# Patient Record
Sex: Female | Born: 1975 | Race: White | Hispanic: No | Marital: Married | Smoking: Former smoker
Health system: Southern US, Community
[De-identification: ages and names within clinical notes are randomized; demographics above are authoritative.]

## PROBLEM LIST (undated history)

## (undated) HISTORY — PX: REDUCTION MAMMAPLASTY: SUR839

## (undated) HISTORY — PX: BREAST SURGERY: SHX581

## (undated) HISTORY — PX: HERNIA REPAIR: SHX51

## (undated) HISTORY — PX: AUGMENTATION MAMMAPLASTY: SUR837

## (undated) HISTORY — PX: TONSILLECTOMY AND ADENOIDECTOMY: SHX28

---

## 2018-03-14 ENCOUNTER — Ambulatory Visit (INDEPENDENT_AMBULATORY_CARE_PROVIDER_SITE_OTHER): Payer: Managed Care, Other (non HMO) | Admitting: Obstetrics and Gynecology

## 2018-03-14 ENCOUNTER — Other Ambulatory Visit (HOSPITAL_COMMUNITY)
Admission: RE | Admit: 2018-03-14 | Discharge: 2018-03-14 | Disposition: A | Discharge status: Home / Self Care | Payer: Managed Care, Other (non HMO) | Source: Ambulatory Visit | Attending: Obstetrics and Gynecology | Admitting: Obstetrics and Gynecology

## 2018-03-14 ENCOUNTER — Encounter: Payer: Self-pay | Admitting: Obstetrics and Gynecology

## 2018-03-14 VITALS — BP 115/78 | HR 78 | Ht 64.0 in | Wt 189.1 lb

## 2018-03-14 DIAGNOSIS — Z124 Encounter for screening for malignant neoplasm of cervix: Secondary | ICD-10-CM | POA: Insufficient documentation

## 2018-03-14 DIAGNOSIS — Z01419 Encounter for gynecological examination (general) (routine) without abnormal findings: Secondary | ICD-10-CM

## 2018-03-14 DIAGNOSIS — Z1239 Encounter for other screening for malignant neoplasm of breast: Secondary | ICD-10-CM

## 2018-03-14 NOTE — Progress Notes (Signed)
HPI:      [name] is a 43 y.o. No obstetric history on file. who LMP was No LMP recorded.  Subjective:   She presents today for her annual examination.  She has no specific complaints.  She has never had a mammogram and knows that she is overdue.  She has an IUD in place for 7 years (Mirena) she still is not having menstrual periods.  She would like it removed and replaced in the near future.  She is using it for birth control.  She is due for blood work (basic screening) and Pap.    Hx: The following portions of the patient's history were reviewed and updated as appropriate:             She  has no past medical history on file. She does not have a problem list on file. She  has a past surgical history that includes Cesarean section; Hernia repair; Tonsillectomy and adenoidectomy; and Breast surgery. Her family history includes Cancer in her father; Diabetes in her mother; Heart failure in her father. She  reports that she has quit smoking. She has never used smokeless tobacco. She reports current alcohol use. She reports that she does not use drugs. She currently has no medications in their medication list. She has no allergies on file.       Review of Systems:  Review of Systems  Constitutional: Denied constitutional symptoms, night sweats, recent illness, fatigue, fever, insomnia and weight loss.  Eyes: Denied eye symptoms, eye pain, photophobia, vision change and visual disturbance.  Ears/Nose/Throat/Neck: Denied ear, nose, throat or neck symptoms, hearing loss, nasal discharge, sinus congestion and sore throat.  Cardiovascular: Denied cardiovascular symptoms, arrhythmia, chest pain/pressure, edema, exercise intolerance, orthopnea and palpitations.  Respiratory: Denied pulmonary symptoms, asthma, pleuritic pain, productive sputum, cough, dyspnea and wheezing.  Gastrointestinal: Denied, gastro-esophageal reflux, melena, nausea and vomiting.  Genitourinary: Denied genitourinary  symptoms including symptomatic vaginal discharge, pelvic relaxation issues, and urinary complaints.  Musculoskeletal: Denied musculoskeletal symptoms, stiffness, swelling, muscle weakness and myalgia.  Dermatologic: Denied dermatology symptoms, rash and scar.  Neurologic: Denied neurology symptoms, dizziness, headache, neck pain and syncope.  Psychiatric: Denied psychiatric symptoms, anxiety and depression.  Endocrine: Denied endocrine symptoms including hot flashes and night sweats.   Meds:   No current outpatient medications on file prior to visit.   No current facility-administered medications on file prior to visit.     Objective:     Vitals:   03/14/18 1108  BP: 115/78  Pulse: 78              Physical examination General NAD, Conversant  HEENT Atraumatic; Op clear with mmm.  Normo-cephalic. Pupils reactive. Anicteric sclerae  Thyroid/Neck Smooth without nodularity or enlargement. Normal ROM.  Neck Supple.  Skin No rashes, lesions or ulceration. Normal palpated skin turgor. No nodularity.  Breasts: No masses or discharge.  Symmetric.  No axillary adenopathy.  Lungs: Clear to auscultation.No rales or wheezes. Normal Respiratory effort, no retractions.  Heart: NSR.  No murmurs or rubs appreciated. No periferal edema  Abdomen: Soft.  Non-tender.  No masses.  No HSM. No hernia  Extremities: Moves all appropriately.  Normal ROM for age. No lymphadenopathy.  Neuro: Oriented to PPT.  Normal mood. Normal affect.     Pelvic:   Vulva: Normal appearance.  No lesions.  Vagina: No lesions or abnormalities noted.  Support:  Second-degree cystocele and rectocele  Urethra No masses tenderness or scarring.  Meatus Normal size  without lesions or prolapse.  Cervix: Normal appearance.  No lesions.  IUD strings noted at the cervical loss  Anus: Normal exam.  No lesions.  Perineum: Normal exam.  No lesions.        Bimanual   Uterus: Normal size.  Non-tender.  Mobile.  AV.  Adnexae: No  masses.  Non-tender to palpation.  Cul-de-sac: Negative for abnormality.      Assessment:    No obstetric history on file. There are no active problems to display for this patient.    1. Well woman exam   2. Breast cancer screening   3. Cervical cancer screening     Normal exam-IUD in place but overdue for removal and replacement   Plan:            1.  Basic Screening Recommendations The basic screening recommendations for asymptomatic women were discussed with the patient during her visit.  The age-appropriate recommendations were discussed with her and the rational for the tests reviewed.  When I am informed by the patient that another primary care physician has previously obtained the age-appropriate tests and they are up-to-date, only outstanding tests are ordered and referrals given as necessary.  Abnormal results of tests will be discussed with her when all of her results are completed. Pap performed-mammogram ordered-blood work ordered. 2.  Patient will schedule for IUD removal and replacement. Orders Orders Placed This Encounter  Procedures  . MM 3D SCREEN BREAST BILATERAL  . Glucose, random  . Lipid panel  . TSH    No orders of the defined types were placed in this encounter.       F/U  No follow-ups on file.  Elonda Husky, M.D. 03/14/2018 11:47 AM

## 2018-03-14 NOTE — Progress Notes (Signed)
Patient comes in today for new GYN physical. She is due for Pap and labs today. Patient has eaten. She does not have a family history of breast cancer.

## 2018-03-20 ENCOUNTER — Encounter: Payer: Self-pay | Admitting: Obstetrics and Gynecology

## 2018-03-20 ENCOUNTER — Other Ambulatory Visit: Payer: Managed Care, Other (non HMO)

## 2018-03-20 ENCOUNTER — Ambulatory Visit (INDEPENDENT_AMBULATORY_CARE_PROVIDER_SITE_OTHER): Payer: Managed Care, Other (non HMO) | Admitting: Obstetrics and Gynecology

## 2018-03-20 VITALS — BP 121/80 | HR 55 | Ht 64.0 in | Wt 191.3 lb

## 2018-03-20 DIAGNOSIS — Z30433 Encounter for removal and reinsertion of intrauterine contraceptive device: Secondary | ICD-10-CM

## 2018-03-20 NOTE — Progress Notes (Signed)
Patient comes in today for IUD removal and reinsertion. No concerns today.

## 2018-03-20 NOTE — Progress Notes (Signed)
HPI:      [name] is a 43 y.o. No obstetric history on file. who LMP was No LMP recorded.  Subjective:   She presents today for IUD removal and reinsertion.  Her IUD is overdue for removal but she has not begun having menstrual periods yet.    Hx: The following portions of the patient's history were reviewed and updated as appropriate:             She  has no past medical history on file. She does not have a problem list on file. She  has a past surgical history that includes Cesarean section; Hernia repair; Tonsillectomy and adenoidectomy; and Breast surgery. Her family history includes Cancer in her father; Diabetes in her mother; Heart failure in her father. She  reports that she has quit smoking. She has never used smokeless tobacco. She reports current alcohol use. She reports that she does not use drugs. She currently has no medications in their medication list. She has no allergies on file.       Review of Systems:  Review of Systems  Constitutional: Denied constitutional symptoms, night sweats, recent illness, fatigue, fever, insomnia and weight loss.  Eyes: Denied eye symptoms, eye pain, photophobia, vision change and visual disturbance.  Ears/Nose/Throat/Neck: Denied ear, nose, throat or neck symptoms, hearing loss, nasal discharge, sinus congestion and sore throat.  Cardiovascular: Denied cardiovascular symptoms, arrhythmia, chest pain/pressure, edema, exercise intolerance, orthopnea and palpitations.  Respiratory: Denied pulmonary symptoms, asthma, pleuritic pain, productive sputum, cough, dyspnea and wheezing.  Gastrointestinal: Denied, gastro-esophageal reflux, melena, nausea and vomiting.  Genitourinary: Denied genitourinary symptoms including symptomatic vaginal discharge, pelvic relaxation issues, and urinary complaints.  Musculoskeletal: Denied musculoskeletal symptoms, stiffness, swelling, muscle weakness and myalgia.  Dermatologic: Denied dermatology symptoms,  rash and scar.  Neurologic: Denied neurology symptoms, dizziness, headache, neck pain and syncope.  Psychiatric: Denied psychiatric symptoms, anxiety and depression.  Endocrine: Denied endocrine symptoms including hot flashes and night sweats.   Meds:   No current outpatient medications on file prior to visit.   No current facility-administered medications on file prior to visit.     Objective:     Vitals:   03/20/18 0831  BP: 121/80  Pulse: (!) 55    Physical examination   Pelvic:   Vulva: Normal appearance.  No lesions.  Vagina: No lesions or abnormalities noted.  Support: Normal pelvic support.  Urethra No masses tenderness or scarring.  Meatus Normal size without lesions or prolapse.  Cervix: Normal appearance.  No lesions.  Anus: Normal exam.  No lesions.  Perineum: Normal exam.  No lesions.        Bimanual   Uterus: Normal size.  Non-tender.  Mobile.  AV.  Adnexae: No masses.  Non-tender to palpation.  Cul-de-sac: Negative for abnormality.   IUD Removal Strings of IUD identified and grasped.  IUD removed without problem.  Pt tolerated this well.  IUD noted to be intact.   IUD Procedure Pt has read the booklet and signed the appropriate forms regarding the Mirena IUD.  All of her questions have been answered.   The cervix was cleansed with betadine solution.  After sounding the uterus and noting the position, the IUD was placed in the usual manner without problem.  The string was cut to the appropriate length.  The patient tolerated the procedure well.              Assessment:    No obstetric history on file. There are  no active problems to display for this patient.    1. Encounter for removal and reinsertion of intrauterine contraceptive device (IUD)       Plan:             F/U  Return in about 4 weeks (around 04/17/2018) for For IUD f/u.  Elonda Husky, M.D. 03/20/2018 9:04 AM

## 2018-03-21 LAB — LIPID PANEL
Chol/HDL Ratio: 4.5 ratio — ABNORMAL HIGH (ref 0.0–4.4)
Cholesterol, Total: 196 mg/dL (ref 100–199)
HDL: 44 mg/dL (ref >39)
LDL Calculated: 128 mg/dL — ABNORMAL HIGH (ref 0–99)
Triglycerides: 118 mg/dL (ref 0–149)
VLDL Cholesterol Cal: 24 mg/dL (ref 5–40)

## 2018-03-21 LAB — GLUCOSE, RANDOM: Glucose: 85 mg/dL (ref 65–99)

## 2018-03-21 LAB — TSH: TSH: 4.89 u[IU]/mL — ABNORMAL HIGH (ref 0.450–4.500)

## 2018-03-21 LAB — CYTOLOGY - PAP: HPV: NOT DETECTED

## 2018-04-16 ENCOUNTER — Encounter: Payer: Managed Care, Other (non HMO) | Admitting: Obstetrics and Gynecology

## 2018-04-17 ENCOUNTER — Encounter: Payer: Self-pay | Admitting: Obstetrics and Gynecology

## 2018-04-17 ENCOUNTER — Other Ambulatory Visit: Payer: Self-pay

## 2018-04-17 ENCOUNTER — Ambulatory Visit (INDEPENDENT_AMBULATORY_CARE_PROVIDER_SITE_OTHER): Payer: Managed Care, Other (non HMO) | Admitting: Obstetrics and Gynecology

## 2018-04-17 VITALS — Ht 64.0 in | Wt 191.0 lb

## 2018-04-17 DIAGNOSIS — Z30431 Encounter for routine checking of intrauterine contraceptive device: Secondary | ICD-10-CM

## 2018-04-17 DIAGNOSIS — E039 Hypothyroidism, unspecified: Secondary | ICD-10-CM | POA: Diagnosis not present

## 2018-04-17 NOTE — Progress Notes (Signed)
Virtual Visit via Telephone Note  I connected with Lynn Rhodes on 04/17/18 at  2:30 PM EDT by telephone and verified that I am speaking with the correct person using two identifiers.   I discussed the limitations, risks, security and privacy concerns of performing an evaluation and management service by telephone and the availability of in person appointments. I also discussed with the patient that there may be a patient responsible charge related to this service. The patient expressed understanding and agreed to proceed.  Location of patient:  Home  Patient gave explicit verbal consent for telephone visit:  YES  Location of provider:  [facility] office  Persons other than physician and patient involved in provider conference:  None  History of Present Illness:    Patient states she has no problems with her IUD.  Had a few days of spotting but this resolved. She would like to know the results of her other lab work.  She has seen them on my chart and had some concerns.    Observations/Objective:      Assessment: Elevated TSH-possible subclinical hypothyroidism. Abnormal cytology on Pap but negative HPV-this is certainly unusual and doubt of immediate clinical significance. Borderline cholesterol with elevated LDL  Plan:   As patient is subjectively asymptomatic I would recommend full thyroid panel after COVID-19 settles.  I have discussed this in detail with her. Discussed HPV and cytology in the role of cervical cancer.  Discussed possible future colposcopy.  I have recommended an annual examination next year with a Pap and HPV testing again 1 year from this. Discussed dietary recommendations regarding elevated cholesterol and LDL.  Patient will undergo modified diet and attempt to reduce her cholesterol level.   Follow Up Instructions:   Patient to follow-up when COVID-19 becomes less of an issue for full thyroid panel.    I discussed the assessment and treatment plan with the patient.   The patient agreed with the plan and demonstrated an understanding of the instructions.  She was given an opportunity to ask questions and all of her questions were answered.   The patient was advised to call back or seek an in-person evaluation if the symptoms worsen or if the condition fails to improve as anticipated.  I provided 16 minutes of non-face-to-face time during this encounter.   Brennan Bailey, MD

## 2018-05-19 ENCOUNTER — Telehealth: Payer: Self-pay

## 2018-05-19 NOTE — Telephone Encounter (Signed)
Patient called and left a message to make an appointment. No answer when the patient was called back.

## 2018-11-04 ENCOUNTER — Telehealth: Payer: Self-pay | Admitting: Obstetrics and Gynecology

## 2018-11-04 ENCOUNTER — Other Ambulatory Visit: Payer: Self-pay

## 2018-11-04 DIAGNOSIS — Z20822 Contact with and (suspected) exposure to covid-19: Secondary | ICD-10-CM

## 2018-11-04 NOTE — Telephone Encounter (Signed)
The patient called and stated that she needs to have covid-19 testing done. The patient is requesting an order/referral to be placed. Please advise.

## 2018-11-04 NOTE — Telephone Encounter (Signed)
LM for patient that she does not need an order to have covid test. She is able to go through the drive up testing site at [facility]. Left number to call back if she has any questions.

## 2018-11-05 LAB — NOVEL CORONAVIRUS, NAA: SARS-CoV-2, NAA: NOT DETECTED

## 2019-03-04 ENCOUNTER — Other Ambulatory Visit
Admission: RE | Admit: 2019-03-04 | Discharge: 2019-03-04 | Disposition: A | Discharge status: Home / Self Care | Payer: Managed Care, Other (non HMO) | Source: Ambulatory Visit | Attending: Gastroenterology | Admitting: Gastroenterology

## 2019-03-04 DIAGNOSIS — R1084 Generalized abdominal pain: Secondary | ICD-10-CM | POA: Insufficient documentation

## 2019-03-04 LAB — COMPREHENSIVE METABOLIC PANEL
ALT: 23 U/L (ref 0–44)
AST: 22 U/L (ref 15–41)
Albumin: 4.2 g/dL (ref 3.5–5.0)
Alkaline Phosphatase: 56 U/L (ref 38–126)
Anion gap: 11 (ref 5–15)
BUN: 8 mg/dL (ref 6–20)
CO2: 24 mmol/L (ref 22–32)
Calcium: 9.1 mg/dL (ref 8.9–10.3)
Chloride: 102 mmol/L (ref 98–111)
Creatinine, Ser: 0.69 mg/dL (ref 0.44–1.00)
GFR calc Af Amer: >60 mL/min (ref >60)
GFR calc non Af Amer: >60 mL/min (ref >60)
Glucose, Bld: 97 mg/dL (ref 70–99)
Potassium: 3.6 mmol/L (ref 3.5–5.1)
Sodium: 137 mmol/L (ref 135–145)
Total Bilirubin: 0.7 mg/dL (ref 0.3–1.2)
Total Protein: 7.8 g/dL (ref 6.5–8.1)

## 2019-03-04 LAB — CBC WITH DIFFERENTIAL/PLATELET
Abs Immature Granulocytes: 0.02 10*3/uL (ref 0.00–0.07)
Basophils Absolute: 0 10*3/uL (ref 0.0–0.1)
Basophils Relative: 1 %
Eosinophils Absolute: 0.2 10*3/uL (ref 0.0–0.5)
Eosinophils Relative: 3 %
HCT: 46.5 % — ABNORMAL HIGH (ref 36.0–46.0)
Hemoglobin: 15.5 g/dL — ABNORMAL HIGH (ref 12.0–15.0)
Immature Granulocytes: 0 %
Lymphocytes Relative: 24 %
Lymphs Abs: 1.7 10*3/uL (ref 0.7–4.0)
MCH: 32.7 pg (ref 26.0–34.0)
MCHC: 33.3 g/dL (ref 30.0–36.0)
MCV: 98.1 fL (ref 80.0–100.0)
Monocytes Absolute: 0.5 10*3/uL (ref 0.1–1.0)
Monocytes Relative: 7 %
Neutro Abs: 4.6 10*3/uL (ref 1.7–7.7)
Neutrophils Relative %: 65 %
Platelets: 244 10*3/uL (ref 150–400)
RBC: 4.74 MIL/uL (ref 3.87–5.11)
RDW: 11.7 % (ref 11.5–15.5)
WBC: 7 10*3/uL (ref 4.0–10.5)
nRBC: 0 % (ref 0.0–0.2)

## 2019-03-04 LAB — HCG, QUANTITATIVE, PREGNANCY: hCG, Beta Chain, Quant, S: <1 m[IU]/mL (ref <5)

## 2019-03-04 LAB — LIPASE, BLOOD: Lipase: 22 U/L (ref 11–51)

## 2019-03-06 ENCOUNTER — Other Ambulatory Visit: Payer: Self-pay | Admitting: Gastroenterology

## 2019-03-06 ENCOUNTER — Ambulatory Visit
Admission: RE | Admit: 2019-03-06 | Discharge: 2019-03-06 | Disposition: A | Discharge status: Home / Self Care | Payer: Managed Care, Other (non HMO) | Source: Ambulatory Visit | Attending: Gastroenterology | Admitting: Gastroenterology

## 2019-03-06 ENCOUNTER — Other Ambulatory Visit: Payer: Self-pay

## 2019-03-06 DIAGNOSIS — R1084 Generalized abdominal pain: Secondary | ICD-10-CM | POA: Diagnosis not present

## 2019-03-06 MED ORDER — IOHEXOL 300 MG/ML  SOLN
100.0000 mL | Freq: Once | INTRAMUSCULAR | Status: AC | PRN
  Administered 2019-03-06: 100 mL via INTRAVENOUS

## 2019-10-02 ENCOUNTER — Other Ambulatory Visit: Payer: Self-pay

## 2019-10-02 ENCOUNTER — Ambulatory Visit: Payer: Managed Care, Other (non HMO) | Admitting: Obstetrics and Gynecology

## 2019-10-02 ENCOUNTER — Encounter: Payer: Self-pay | Admitting: Obstetrics and Gynecology

## 2019-10-02 VITALS — BP 119/79 | HR 75 | Ht 64.0 in | Wt 200.2 lb

## 2019-10-02 DIAGNOSIS — F32A Depression, unspecified: Secondary | ICD-10-CM

## 2019-10-02 DIAGNOSIS — F329 Major depressive disorder, single episode, unspecified: Secondary | ICD-10-CM

## 2019-10-02 MED ORDER — SERTRALINE HCL 50 MG PO TABS: 50.0000 mg | ORAL_TABLET | Freq: Every day | ORAL | 1 refills | Status: DC

## 2019-10-02 NOTE — Progress Notes (Signed)
HPI:      [name] is a 44 y.o. [id number] who LMP was No LMP recorded (lmp unknown). (Menstrual status: IUD).  Subjective:   She presents today because she states that she has had depressed feelings and lack of emotions which seem to be getting worse. She has had issues like this throughout her life especially after childbirth but at this time it seems to be more of an issue affecting her daily life. She has expressed feelings of being overwhelmed. She has stated that she often finds little enjoyment in life. She says that she sees her role as a mother and wife but does not "know exactly who she really is". She reports her current marriage as good and she is happy and satisfied in her relationship with her husband.    Hx: The following portions of the patient's history were reviewed and updated as appropriate:             She  has no past medical history on file. She does not have a problem list on file. She  has a past surgical history that includes Cesarean section; Hernia repair; Tonsillectomy and adenoidectomy; and Breast surgery. Her family history includes Cancer in her father; Diabetes in her mother; Heart failure in her father. She  reports that she has quit smoking. She has never used smokeless tobacco. She reports current alcohol use. She reports that she does not use drugs. She has a current medication list which includes the following prescription(s): pantoprazole. She is allergic to erythromycin, red dye, sulfisoxazole, and penicillins.       Review of Systems:  Review of Systems  Constitutional: Denied constitutional symptoms, night sweats, recent illness, fatigue, fever, insomnia and weight loss.  Eyes: Denied eye symptoms, eye pain, photophobia, vision change and visual disturbance.  Ears/Nose/Throat/Neck: Denied ear, nose, throat or neck symptoms, hearing loss, nasal discharge, sinus congestion and sore throat.  Cardiovascular: Denied cardiovascular symptoms, arrhythmia,  chest pain/pressure, edema, exercise intolerance, orthopnea and palpitations.  Respiratory: Denied pulmonary symptoms, asthma, pleuritic pain, productive sputum, cough, dyspnea and wheezing.  Gastrointestinal: Denied, gastro-esophageal reflux, melena, nausea and vomiting.  Genitourinary: Denied genitourinary symptoms including symptomatic vaginal discharge, pelvic relaxation issues, and urinary complaints.  Musculoskeletal: Denied musculoskeletal symptoms, stiffness, swelling, muscle weakness and myalgia.  Dermatologic: Denied dermatology symptoms, rash and scar.  Neurologic: Denied neurology symptoms, dizziness, headache, neck pain and syncope.  Psychiatric: Denied psychiatric symptoms, anxiety and depression.  Endocrine: Denied endocrine symptoms including hot flashes and night sweats.   Meds:   Current Outpatient Medications on File Prior to Visit  Medication Sig Dispense Refill  . pantoprazole (PROTONIX) 40 MG tablet Take 40 mg by mouth daily.     No current facility-administered medications on file prior to visit.    Objective:     Vitals:   10/02/19 0840  BP: 119/79  Pulse: 75                Assessment:    [id number] There are no problems to display for this patient.    1. Depression, unspecified depression type        Plan:            1.  Zoloft.  She previously tried Lexapro and Wellbutrin and was unhappy with those.  She will try Zoloft for the next 2 months.  2.  Advised patient to seek counseling as it seems many of her issues would be amenable to discussion with the counselor.  Orders No  orders of the defined types were placed in this encounter.   No orders of the defined types were placed in this encounter.     F/U  Return in about 2 months (around 12/02/2019). I spent 22 minutes involved in the care of this patient preparing to see the patient by obtaining and reviewing her medical history (including labs, imaging tests and prior  procedures), documenting clinical information in the electronic health record (EHR), counseling and coordinating care plans, writing and sending prescriptions, ordering tests or procedures and directly communicating with the patient by discussing pertinent items from her history and physical exam as well as detailing my assessment and plan as noted above so that she has an informed understanding.  All of her questions were answered.  Elonda Husky, M.D. 10/02/2019 9:42 AM

## 2019-11-30 ENCOUNTER — Telehealth: Payer: Self-pay

## 2019-11-30 DIAGNOSIS — F32A Depression, unspecified: Secondary | ICD-10-CM

## 2019-11-30 MED ORDER — SERTRALINE HCL 50 MG PO TABS: 50.0000 mg | ORAL_TABLET | Freq: Every day | ORAL | 1 refills | Status: DC

## 2019-11-30 NOTE — Telephone Encounter (Signed)
Refill sent to the pharmacy. LM for patient that prescription had been sent in.

## 2019-11-30 NOTE — Telephone Encounter (Signed)
Pt called in and stated that she needs a refill on her Sertraline sent to the [facility] on [facility]. Please advise

## 2019-11-30 NOTE — Telephone Encounter (Signed)
Please advise on refill. Patient has appointment 12/18/2019

## 2019-12-18 ENCOUNTER — Other Ambulatory Visit: Payer: Self-pay

## 2019-12-18 ENCOUNTER — Ambulatory Visit: Payer: Managed Care, Other (non HMO) | Admitting: Obstetrics and Gynecology

## 2019-12-18 ENCOUNTER — Encounter: Payer: Self-pay | Admitting: Obstetrics and Gynecology

## 2019-12-18 DIAGNOSIS — F32A Depression, unspecified: Secondary | ICD-10-CM

## 2019-12-18 MED ORDER — SERTRALINE HCL 50 MG PO TABS: 50.0000 mg | ORAL_TABLET | Freq: Every day | ORAL | 0 refills | Status: DC

## 2019-12-18 NOTE — Progress Notes (Signed)
HPI:      Ms. Katieann Hungate is a 44 y.o. [id number] who LMP was No LMP recorded. (Menstrual status: IUD).  Subjective:   She presents today stating that she is somewhat better on Zoloft.  She still continues to have some issues but has resisted counseling to this point.  Her issues seem to be lack of emotional response to sadness and to happiness.  She would like to have more of an emotional response and her self identified lack of response concerns her.  She says this seems to make her life more of a day-to-day existence rather than an enjoyment.    Hx: The following portions of the patient's history were reviewed and updated as appropriate:             She  has no past medical history on file. She does not have a problem list on file. She  has a past surgical history that includes Cesarean section; Hernia repair; Tonsillectomy and adenoidectomy; and Breast surgery. Her family history includes Cancer in her father; Diabetes in her mother; Heart failure in her father. She  reports that she has quit smoking. She has never used smokeless tobacco. She reports current alcohol use. She reports that she does not use drugs. She has a current medication list which includes the following prescription(s): pantoprazole and sertraline. She is allergic to erythromycin, red dye, sulfisoxazole, and penicillins.       Review of Systems:  Review of Systems  Constitutional: Denied constitutional symptoms, night sweats, recent illness, fatigue, fever, insomnia and weight loss.  Eyes: Denied eye symptoms, eye pain, photophobia, vision change and visual disturbance.  Ears/Nose/Throat/Neck: Denied ear, nose, throat or neck symptoms, hearing loss, nasal discharge, sinus congestion and sore throat.  Cardiovascular: Denied cardiovascular symptoms, arrhythmia, chest pain/pressure, edema, exercise intolerance, orthopnea and palpitations.  Respiratory: Denied pulmonary symptoms, asthma, pleuritic pain, productive sputum,  cough, dyspnea and wheezing.  Gastrointestinal: Denied, gastro-esophageal reflux, melena, nausea and vomiting.  Genitourinary: Denied genitourinary symptoms including symptomatic vaginal discharge, pelvic relaxation issues, and urinary complaints.  Musculoskeletal: Denied musculoskeletal symptoms, stiffness, swelling, muscle weakness and myalgia.  Dermatologic: Denied dermatology symptoms, rash and scar.  Neurologic: Denied neurology symptoms, dizziness, headache, neck pain and syncope.  Psychiatric: See HPI for additional information.  Endocrine: Denied endocrine symptoms including hot flashes and night sweats.   Meds:   Current Outpatient Medications on File Prior to Visit  Medication Sig Dispense Refill   pantoprazole (PROTONIX) 40 MG tablet Take 40 mg by mouth daily.     No current facility-administered medications on file prior to visit.          Objective:     Vitals:   12/18/19 0947  BP: 126/84  Pulse: 62   Filed Weights   12/18/19 0947  Weight: 197 lb 9.6 oz (89.6 kg)                Assessment:    [id number] There are no problems to display for this patient.    1. Depression, unspecified depression type     Patient somewhat helped with Zoloft but continues to experience issues with day-to-day feelings.   Plan:            1.  I believe that mindfulness counseling may be able to target her specific issues and provide her with some coaching/management that will improve her feelings.  We have discussed this in some detail and I believe that I have convinced her to at least try specific  counseling geared toward her lack of emotional response.  At this time will not increase her Zoloft dose. Patient referred to mindfulness counseling, [facility] [place] Orders No orders of the defined types were placed in this encounter.    Meds ordered this encounter  Medications   sertraline (ZOLOFT) 50 MG tablet    Sig: Take 1 tablet (50 mg total) by mouth daily.     Dispense:  90 tablet    Refill:  0      F/U  Return in about 3 months (around 03/17/2020) for Annual Physical. I spent 26 minutes involved in the care of this patient preparing to see the patient by obtaining and reviewing her medical history (including labs, imaging tests and prior procedures), documenting clinical information in the electronic health record (EHR), counseling and coordinating care plans, writing and sending prescriptions, ordering tests or procedures and directly communicating with the patient by discussing pertinent items from her history and physical exam as well as detailing my assessment and plan as noted above so that she has an informed understanding.  All of her questions were answered.  Elonda Husky, M.D. 12/18/2019 11:57 AM

## 2020-01-01 ENCOUNTER — Encounter: Payer: Managed Care, Other (non HMO) | Admitting: Obstetrics and Gynecology

## 2020-03-20 ENCOUNTER — Other Ambulatory Visit: Payer: Self-pay | Admitting: Obstetrics and Gynecology

## 2020-03-20 DIAGNOSIS — F32A Depression, unspecified: Secondary | ICD-10-CM

## 2020-03-24 ENCOUNTER — Encounter: Payer: Managed Care, Other (non HMO) | Admitting: Obstetrics and Gynecology

## 2020-04-13 ENCOUNTER — Encounter: Payer: Managed Care, Other (non HMO) | Admitting: Obstetrics and Gynecology

## 2020-04-27 ENCOUNTER — Other Ambulatory Visit: Payer: Self-pay

## 2020-04-27 ENCOUNTER — Ambulatory Visit (INDEPENDENT_AMBULATORY_CARE_PROVIDER_SITE_OTHER): Payer: Managed Care, Other (non HMO) | Admitting: Obstetrics and Gynecology

## 2020-04-27 ENCOUNTER — Other Ambulatory Visit (HOSPITAL_COMMUNITY)
Admission: RE | Admit: 2020-04-27 | Discharge: 2020-04-27 | Disposition: A | Discharge status: Home / Self Care | Payer: Managed Care, Other (non HMO) | Source: Ambulatory Visit | Attending: Obstetrics and Gynecology | Admitting: Obstetrics and Gynecology

## 2020-04-27 ENCOUNTER — Encounter: Payer: Self-pay | Admitting: Obstetrics and Gynecology

## 2020-04-27 VITALS — BP 112/73 | HR 102 | Ht 64.0 in | Wt 203.4 lb

## 2020-04-27 DIAGNOSIS — Z124 Encounter for screening for malignant neoplasm of cervix: Secondary | ICD-10-CM | POA: Diagnosis not present

## 2020-04-27 DIAGNOSIS — N393 Stress incontinence (female) (male): Secondary | ICD-10-CM

## 2020-04-27 DIAGNOSIS — N8189 Other female genital prolapse: Secondary | ICD-10-CM | POA: Diagnosis not present

## 2020-04-27 DIAGNOSIS — Z01419 Encounter for gynecological examination (general) (routine) without abnormal findings: Secondary | ICD-10-CM | POA: Diagnosis not present

## 2020-04-27 NOTE — Progress Notes (Signed)
HPI:      Ms. Bryona Foxworthy is a 45 y.o. [id number] who LMP was No LMP recorded (lmp unknown). (Menstrual status: IUD).  Subjective:   She presents today for her annual examination.  She was previously taking Zoloft but has not taken it for more than 2 weeks.  She states that she has "diverticulitis" and it causes diarrhea.  She states that her Zoloft also occasionally causes diarrhea so she stopped the Zoloft.  At this point she has not noticed any difference in her mood since discontinuation.  She does state that her mood and affect have generally improved and she is not having the issues that she had before. She does complain of significant urine loss.  This mainly occurs with coughing laughing sneezing intercourse.  She noticed this was very bad during the time period when she had a cough with Covid.  This required her to wear protection daily.     Hx: The following portions of the patient's history were reviewed and updated as appropriate:             She  has no past medical history on file. She does not have a problem list on file. She  has a past surgical history that includes Cesarean section; Hernia repair; Tonsillectomy and adenoidectomy; and Breast surgery. Her family history includes Cancer in her father; Diabetes in her mother; Heart failure in her father. She  reports that she has quit smoking. She has never used smokeless tobacco. She reports current alcohol use. She reports that she does not use drugs. She has a current medication list which includes the following prescription(s): pantoprazole and sertraline. She is allergic to erythromycin, red dye, sulfisoxazole, and penicillins.       Review of Systems:  Review of Systems  Constitutional: Denied constitutional symptoms, night sweats, recent illness, fatigue, fever, insomnia and weight loss.  Eyes: Denied eye symptoms, eye pain, photophobia, vision change and visual disturbance.  Ears/Nose/Throat/Neck: Denied ear, nose, throat or  neck symptoms, hearing loss, nasal discharge, sinus congestion and sore throat.  Cardiovascular: Denied cardiovascular symptoms, arrhythmia, chest pain/pressure, edema, exercise intolerance, orthopnea and palpitations.  Respiratory: Denied pulmonary symptoms, asthma, pleuritic pain, productive sputum, cough, dyspnea and wheezing.  Gastrointestinal: Denied, gastro-esophageal reflux, melena, nausea and vomiting.  Genitourinary: See HPI for additional information.  Musculoskeletal: Denied musculoskeletal symptoms, stiffness, swelling, muscle weakness and myalgia.  Dermatologic: Denied dermatology symptoms, rash and scar.  Neurologic: Denied neurology symptoms, dizziness, headache, neck pain and syncope.  Psychiatric: Denied psychiatric symptoms, anxiety and depression.  Endocrine: Denied endocrine symptoms including hot flashes and night sweats.   Meds:   Current Outpatient Medications on File Prior to Visit  Medication Sig Dispense Refill  . pantoprazole (PROTONIX) 40 MG tablet Take 40 mg by mouth daily.    . sertraline (ZOLOFT) 50 MG tablet TAKE 1 TABLET(50 MG) BY MOUTH DAILY 90 tablet 0   No current facility-administered medications on file prior to visit.          Objective:     Vitals:   04/27/20 0903  BP: 112/73  Pulse: (!) 102    Filed Weights   04/27/20 0903  Weight: 203 lb 6.4 oz (92.3 kg)              Physical examination General NAD, Conversant  HEENT Atraumatic; Op clear with mmm.  Normo-cephalic. Pupils reactive. Anicteric sclerae  Thyroid/Neck Smooth without nodularity or enlargement. Normal ROM.  Neck Supple.  Skin No rashes, lesions or ulceration.  Normal palpated skin turgor. No nodularity.  Breasts: No masses or discharge.  Symmetric.  No axillary adenopathy.  Lungs: Clear to auscultation.No rales or wheezes. Normal Respiratory effort, no retractions.  Heart: NSR.  No murmurs or rubs appreciated. No periferal edema  Abdomen: Soft.  Non-tender.  No masses.   No HSM. No hernia  Extremities: Moves all appropriately.  Normal ROM for age. No lymphadenopathy.  Neuro: Oriented to PPT.  Normal mood. Normal affect.     Pelvic:   Vulva: Normal appearance.  No lesions.  Vagina: No lesions or abnormalities noted.  Support:  Third-degree cystocele second-degree rectocele  Urethra No masses tenderness or scarring.  Meatus Normal size without lesions or prolapse.  Cervix: Normal appearance.  No lesions.  Anus: Normal exam.  No lesions.  Perineum: Normal exam.  No lesions.        Bimanual   Uterus: Normal size.  Non-tender.  Mobile.  AV.  Adnexae: No masses.  Non-tender to palpation.  Cul-de-sac: Negative for abnormality.      Assessment:    [id number] There are no problems to display for this patient.    1. Well woman exam with routine gynecological exam   2. SUI (stress urinary incontinence, female)   3. Screening for cervical cancer   4. Pelvic relaxation        Plan:            1.  Basic Screening Recommendations The basic screening recommendations for asymptomatic women were discussed with the patient during her visit.  The age-appropriate recommendations were discussed with her and the rational for the tests reviewed.  When I am informed by the patient that another primary care physician has previously obtained the age-appropriate tests and they are up-to-date, only outstanding tests are ordered and referrals given as necessary.  Abnormal results of tests will be discussed with her when all of her results are completed.  Routine preventative health maintenance measures emphasized: Exercise/Diet/Weight control, Tobacco Warnings, Alcohol/Substance use risks and Stress Management Pap cotest performed-mammogram ordered-blood work today. 2.  Strongly recommend colonoscopy for chronic diarrhea. 3.  We spent a significant amount of time discussing stress urinary incontinence and the possible treatments including surgery.  She is considering  surgery as an option in the future. 4.  We also discussed her issues with mood and Zoloft use.  Orders Orders Placed This Encounter  Procedures  . Hemoglobin A1c  . Lipid panel  . Thyroid Profile    No orders of the defined types were placed in this encounter.         F/U  Return in about 1 year (around 04/27/2021) for Annual Physical. I spent 18 additional minutes involved in the care of this patient preparing to see the patient by obtaining and reviewing her medical history (including labs, imaging tests and prior procedures), documenting clinical information in the electronic health record (EHR), counseling and coordinating care plans, writing and sending prescriptions, ordering tests or procedures and directly communicating with the patient by discussing pertinent items from her history and physical exam as well as detailing my assessment and plan as noted above so that she has an informed understanding.  Much of this has to do with stress urinary incontinence and her issues with mood Zoloft and diarrhea.  All of her questions were answered.  Elonda Husky, M.D. 04/27/2020 10:02 AM

## 2020-04-28 LAB — LIPID PANEL
Chol/HDL Ratio: 5.6 ratio — ABNORMAL HIGH (ref 0.0–4.4)
Cholesterol, Total: 189 mg/dL (ref 100–199)
HDL: 34 mg/dL — ABNORMAL LOW (ref >39)
LDL Chol Calc (NIH): 129 mg/dL — ABNORMAL HIGH (ref 0–99)
Triglycerides: 145 mg/dL (ref 0–149)
VLDL Cholesterol Cal: 26 mg/dL (ref 5–40)

## 2020-04-28 LAB — THYROID PANEL
Free Thyroxine Index: 2.4 (ref 1.2–4.9)
T3 Uptake Ratio: 29 % (ref 24–39)
T4, Total: 8.2 ug/dL (ref 4.5–12.0)

## 2020-04-28 LAB — HEMOGLOBIN A1C
Est. average glucose Bld gHb Est-mCnc: 111 mg/dL
Hgb A1c MFr Bld: 5.5 % (ref 4.8–5.6)

## 2020-04-29 LAB — CYTOLOGY - PAP
Adequacy: ABSENT
Comment: NEGATIVE
Diagnosis: NEGATIVE
High risk HPV: NEGATIVE

## 2020-06-18 ENCOUNTER — Other Ambulatory Visit: Payer: Self-pay | Admitting: Obstetrics and Gynecology

## 2020-06-18 DIAGNOSIS — F32A Depression, unspecified: Secondary | ICD-10-CM

## 2021-05-03 ENCOUNTER — Encounter: Payer: Managed Care, Other (non HMO) | Admitting: Obstetrics and Gynecology

## 2021-05-03 DIAGNOSIS — Z01419 Encounter for gynecological examination (general) (routine) without abnormal findings: Secondary | ICD-10-CM

## 2021-05-03 DIAGNOSIS — Z1231 Encounter for screening mammogram for malignant neoplasm of breast: Secondary | ICD-10-CM

## 2021-05-15 ENCOUNTER — Other Ambulatory Visit: Payer: Self-pay | Admitting: Obstetrics and Gynecology

## 2021-05-15 DIAGNOSIS — Z1231 Encounter for screening mammogram for malignant neoplasm of breast: Secondary | ICD-10-CM

## 2021-06-13 ENCOUNTER — Ambulatory Visit (INDEPENDENT_AMBULATORY_CARE_PROVIDER_SITE_OTHER): Payer: Managed Care, Other (non HMO) | Admitting: Obstetrics and Gynecology

## 2021-06-13 ENCOUNTER — Encounter: Payer: Self-pay | Admitting: Obstetrics and Gynecology

## 2021-06-13 VITALS — BP 115/77 | HR 62 | Ht 64.0 in | Wt 200.7 lb

## 2021-06-13 DIAGNOSIS — Z01419 Encounter for gynecological examination (general) (routine) without abnormal findings: Secondary | ICD-10-CM

## 2021-06-13 DIAGNOSIS — N393 Stress incontinence (female) (male): Secondary | ICD-10-CM | POA: Diagnosis not present

## 2021-06-13 NOTE — Progress Notes (Signed)
HPI:      Ms. Jayleene Glaeser is a 46 y.o. [id number] who LMP was No LMP recorded. (Menstrual status: IUD).  Subjective:   She presents today for her annual examination.  She is generally doing well.  She continues to have occasional issues with urine loss.  Whenever she has a cough/bronchitis it gets bad and then as her coughing decreases she improves.  She generally wears pads most of the time.  She has been considering definitive surgery but is not quite ready yet. She is scheduled for mammogram and colonoscopy in the near future.    Hx: The following portions of the patient's history were reviewed and updated as appropriate:             She  has no past medical history on file. She does not have a problem list on file. She  has a past surgical history that includes Cesarean section; Hernia repair; Tonsillectomy and adenoidectomy; and Breast surgery. Her family history includes Cancer in her father; Diabetes in her mother; Heart failure in her father. She  reports that she has quit smoking. She has never used smokeless tobacco. She reports that she does not currently use alcohol. She reports that she does not use drugs. She has a current medication list which includes the following prescription(s): escitalopram and pantoprazole. She is allergic to erythromycin, red dye, sulfisoxazole, and penicillins.       Review of Systems:  Review of Systems  Constitutional: Denied constitutional symptoms, night sweats, recent illness, fatigue, fever, insomnia and weight loss.  Eyes: Denied eye symptoms, eye pain, photophobia, vision change and visual disturbance.  Ears/Nose/Throat/Neck: Denied ear, nose, throat or neck symptoms, hearing loss, nasal discharge, sinus congestion and sore throat.  Cardiovascular: Denied cardiovascular symptoms, arrhythmia, chest pain/pressure, edema, exercise intolerance, orthopnea and palpitations.  Respiratory: Denied pulmonary symptoms, asthma, pleuritic pain, productive  sputum, cough, dyspnea and wheezing.  Gastrointestinal: Denied, gastro-esophageal reflux, melena, nausea and vomiting.  Genitourinary: See HPI for additional information.  Musculoskeletal: Denied musculoskeletal symptoms, stiffness, swelling, muscle weakness and myalgia.  Dermatologic: Denied dermatology symptoms, rash and scar.  Neurologic: Denied neurology symptoms, dizziness, headache, neck pain and syncope.  Psychiatric: Denied psychiatric symptoms, anxiety and depression.  Endocrine: Denied endocrine symptoms including hot flashes and night sweats.   Meds:   Current Outpatient Medications on File Prior to Visit  Medication Sig Dispense Refill   escitalopram (LEXAPRO) 10 MG tablet Take 10 mg by mouth daily.     pantoprazole (PROTONIX) 40 MG tablet Take 40 mg by mouth daily.     No current facility-administered medications on file prior to visit.     Objective:     Vitals:   06/13/21 0805  BP: 115/77  Pulse: 62    Filed Weights   06/13/21 0805  Weight: 200 lb 11.2 oz (91 kg)             Physical examination General NAD, Conversant  HEENT Atraumatic; Op clear with mmm.  Normo-cephalic. Pupils reactive. Anicteric sclerae  Thyroid/Neck Smooth without nodularity or enlargement. Normal ROM.  Neck Supple.  Skin No rashes, lesions or ulceration. Normal palpated skin turgor. No nodularity.  Breasts: No masses or discharge.  Symmetric.  No axillary adenopathy.  Lungs: Clear to auscultation.No rales or wheezes. Normal Respiratory effort, no retractions.  Heart: NSR.  No murmurs or rubs appreciated. No periferal edema  Abdomen: Soft.  Non-tender.  No masses.  No HSM. No hernia  Extremities: Moves all appropriately.  Normal ROM  for age. No lymphadenopathy.  Neuro: Oriented to PPT.  Normal mood. Normal affect.     Pelvic:   Vulva: Normal appearance.  No lesions.  Vagina: No lesions or abnormalities noted.  Support: Third-degree cystocele second-degree rectocele.  Generally good  uterine support.  Urethra No masses tenderness or scarring.  Meatus Normal size without lesions or prolapse.  Cervix: Normal appearance.  No lesions.  Anus: Normal exam.  No lesions.  Perineum: Normal exam.  No lesions.        Bimanual   Uterus: Normal size.  Non-tender.  Mobile.  AV.  Adnexae: No masses.  Non-tender to palpation.  Cul-de-sac: Negative for abnormality.     Assessment:    [id number] There are no problems to display for this patient.    1. Well woman exam with routine gynecological exam   2. Stress incontinence in female     Based on her history it sounds as if her stress incontinence has slightly worsened since last year.   Plan:            1.  Basic Screening Recommendations The basic screening recommendations for asymptomatic women were discussed with the patient during her visit.  The age-appropriate recommendations were discussed with her and the rational for the tests reviewed.  When I am informed by the patient that another primary care physician has previously obtained the age-appropriate tests and they are up-to-date, only outstanding tests are ordered and referrals given as necessary.  Abnormal results of tests will be discussed with her when all of her results are completed.  Routine preventative health maintenance measures emphasized: Exercise/Diet/Weight control, Tobacco Warnings, Alcohol/Substance use risks and Stress Management Blood work ordered 2.  Discussed definitive surgery with urethral sling for SUI.  Patient is considering this.  Orders Orders Placed This Encounter  Procedures   Basic metabolic panel   CBC   TSH   Lipid panel   Hemoglobin A1c    No orders of the defined types were placed in this encounter.           F/U  No follow-ups on file.  Elonda Husky, M.D. 06/13/2021 8:49 AM

## 2021-06-13 NOTE — Progress Notes (Signed)
Patients presents for annual exam today. She states still having trouble with incontinence, states typically when she coughs, sneezes, laughs and lifts. Patient is up to date on pap smear and scheduled for annual mammogram and colonoscopy. Patients annual labs are ordered. Patient states no other questions or concerns at this time.

## 2021-06-14 ENCOUNTER — Ambulatory Visit
Admission: RE | Admit: 2021-06-14 | Discharge: 2021-06-14 | Disposition: A | Discharge status: Home / Self Care | Payer: Managed Care, Other (non HMO) | Source: Ambulatory Visit | Attending: Obstetrics and Gynecology | Admitting: Obstetrics and Gynecology

## 2021-06-14 DIAGNOSIS — Z1231 Encounter for screening mammogram for malignant neoplasm of breast: Secondary | ICD-10-CM | POA: Diagnosis present

## 2021-06-21 LAB — LIPID PANEL
Chol/HDL Ratio: 5.3 ratio — ABNORMAL HIGH (ref 0.0–4.4)
Cholesterol, Total: 235 mg/dL — ABNORMAL HIGH (ref 100–199)
HDL: 44 mg/dL (ref >39)
LDL Chol Calc (NIH): 157 mg/dL — ABNORMAL HIGH (ref 0–99)
Triglycerides: 185 mg/dL — ABNORMAL HIGH (ref 0–149)
VLDL Cholesterol Cal: 34 mg/dL (ref 5–40)

## 2021-06-21 LAB — BASIC METABOLIC PANEL
BUN/Creatinine Ratio: 10 (ref 9–23)
BUN: 7 mg/dL (ref 6–24)
CO2: 26 mmol/L (ref 20–29)
Calcium: 9.4 mg/dL (ref 8.7–10.2)
Chloride: 101 mmol/L (ref 96–106)
Creatinine, Ser: 0.68 mg/dL (ref 0.57–1.00)
Glucose: 91 mg/dL (ref 70–99)
Potassium: 4.6 mmol/L (ref 3.5–5.2)
Sodium: 139 mmol/L (ref 134–144)
eGFR: 109 mL/min/{1.73_m2} (ref >59)

## 2021-06-21 LAB — CBC
Hematocrit: 44.5 % (ref 34.0–46.6)
Hemoglobin: 15 g/dL (ref 11.1–15.9)
MCH: 32.4 pg (ref 26.6–33.0)
MCHC: 33.7 g/dL (ref 31.5–35.7)
MCV: 96 fL (ref 79–97)
Platelets: 241 10*3/uL (ref 150–450)
RBC: 4.63 x10E6/uL (ref 3.77–5.28)
RDW: 12.6 % (ref 11.7–15.4)
WBC: 7.5 10*3/uL (ref 3.4–10.8)

## 2021-06-21 LAB — TSH: TSH: 6.09 u[IU]/mL — ABNORMAL HIGH (ref 0.450–4.500)

## 2021-06-21 LAB — HEMOGLOBIN A1C
Est. average glucose Bld gHb Est-mCnc: 108 mg/dL
Hgb A1c MFr Bld: 5.4 % (ref 4.8–5.6)

## 2021-06-27 ENCOUNTER — Encounter: Admission: RE | Payer: Self-pay | Source: Ambulatory Visit

## 2021-06-27 ENCOUNTER — Ambulatory Visit: Admission: RE | Admit: 2021-06-27 | Payer: Managed Care, Other (non HMO) | Source: Ambulatory Visit

## 2021-06-27 SURGERY — COLONOSCOPY WITH PROPOFOL
Anesthesia: General

## 2021-12-22 ENCOUNTER — Other Ambulatory Visit: Payer: Self-pay | Admitting: Family Medicine

## 2021-12-22 DIAGNOSIS — E785 Hyperlipidemia, unspecified: Secondary | ICD-10-CM

## 2021-12-27 ENCOUNTER — Ambulatory Visit
Admission: RE | Admit: 2021-12-27 | Discharge: 2021-12-27 | Disposition: A | Discharge status: Home / Self Care | Payer: Managed Care, Other (non HMO) | Source: Ambulatory Visit | Attending: Family Medicine | Admitting: Family Medicine

## 2021-12-27 DIAGNOSIS — E785 Hyperlipidemia, unspecified: Secondary | ICD-10-CM | POA: Insufficient documentation

## 2022-04-05 ENCOUNTER — Encounter: Payer: Self-pay | Admitting: Obstetrics and Gynecology

## 2022-05-23 ENCOUNTER — Encounter: Payer: Self-pay | Admitting: Obstetrics and Gynecology

## 2022-06-15 ENCOUNTER — Encounter: Payer: Managed Care, Other (non HMO) | Admitting: Obstetrics and Gynecology

## 2022-07-13 ENCOUNTER — Ambulatory Visit: Payer: Managed Care, Other (non HMO) | Admitting: Obstetrics and Gynecology

## 2022-07-24 ENCOUNTER — Ambulatory Visit: Payer: Managed Care, Other (non HMO) | Admitting: Obstetrics and Gynecology

## 2022-07-24 DIAGNOSIS — N393 Stress incontinence (female) (male): Secondary | ICD-10-CM

## 2022-07-24 DIAGNOSIS — N8110 Cystocele, unspecified: Secondary | ICD-10-CM

## 2022-08-07 ENCOUNTER — Ambulatory Visit: Payer: Managed Care, Other (non HMO) | Admitting: Obstetrics and Gynecology

## 2022-08-07 DIAGNOSIS — N393 Stress incontinence (female) (male): Secondary | ICD-10-CM

## 2022-08-07 DIAGNOSIS — N814 Uterovaginal prolapse, unspecified: Secondary | ICD-10-CM

## 2022-12-31 ENCOUNTER — Other Ambulatory Visit: Payer: Self-pay | Admitting: Obstetrics and Gynecology

## 2023-01-03 LAB — SURGICAL PATHOLOGY

## 2024-01-23 ENCOUNTER — Ambulatory Visit

## 2024-01-23 DIAGNOSIS — M6281 Muscle weakness (generalized): Secondary | ICD-10-CM

## 2024-01-23 DIAGNOSIS — R2689 Other abnormalities of gait and mobility: Secondary | ICD-10-CM | POA: Diagnosis not present

## 2024-01-23 DIAGNOSIS — M25671 Stiffness of right ankle, not elsewhere classified: Secondary | ICD-10-CM | POA: Diagnosis not present

## 2024-01-23 DIAGNOSIS — R2681 Unsteadiness on feet: Secondary | ICD-10-CM | POA: Diagnosis not present

## 2024-01-23 NOTE — Therapy (Signed)
 " OUTPATIENT PHYSICAL THERAPY LOWER EXTREMITY EVALUATION   Patient Name: Lynn Rhodes MRN: [id number] DOB:1975-03-18, 49 y.o., female Today's Date: 01/23/2024  END OF SESSION:  PT End of Session - 01/23/24 1257     Visit Number 1    Number of Visits 6    Date for Recertification  03/05/24    Authorization Type [employer] $75 COPAY , 30 VL    Authorization - Visit Number 1    Authorization - Number of Visits 30    PT Start Time 1305    PT Stop Time 1350    PT Time Calculation (min) 45 min    Activity Tolerance Patient tolerated treatment well    Behavior During Therapy [facility] for tasks assessed/performed          History reviewed. No pertinent past medical history. Past Surgical History:  Procedure Laterality Date   AUGMENTATION MAMMAPLASTY     15 years ago   CESAREAN SECTION     2 c sections   HERNIA REPAIR     REDUCTION MAMMAPLASTY     lift done at time of augmentation   TONSILLECTOMY AND ADENOIDECTOMY     There are no active problems to display for this patient.   PCP: Not currently listed in chart   REFERRING PROVIDER: Ozell JINNY Largo, MD   REFERRING DIAG: S82.831A Fracture of Rt distal fibula   THERAPY DIAG:  Stiffness of right ankle, not elsewhere classified  Muscle weakness (generalized)  Other abnormalities of gait and mobility  Unsteadiness on feet  Rationale for Evaluation and Treatment: Rehabilitation  ONSET DATE: September 2025  SUBJECTIVE:   SUBJECTIVE STATEMENT: I just stepped sideways and then I fell.   PERTINENT HISTORY: Patient reports that she grew up playing sports and has had plenty of instability episodes/sprains, though has never fractured any bones before. Patient reports that she stepped into a divot in her yard, but did not go see MD for over 2 months. Patient reports having been cleared by MD after two visits with MD reporting healing of distal fibula fracture with no restrictions for PT. Patient endorses no other surgeries, trauma, or  injuries to the area or surrounding joints. Patient reports that she intermittently has radiating pain into hip.   See PMH or personal factors for in depth comorbidities   PAIN:  Are you having pain? Yes: NPRS scale: 0/10 at rest; 2/10 at max  Pain location: Rt ankle, with random pain radiating up to the hip  Pain description: ache Aggravating factors: positioning, overuse  Relieving factors: moving out of the position, OTC but not often   PRECAUTIONS: Fall  RED FLAGS: Not asked in eval    WEIGHT BEARING RESTRICTIONS: No  FALLS:  Has patient fallen in last 6 months? Yes. Number of falls 2, initial fall that caused the break and the second one was before seeing the MD and it gave out   LIVING ENVIRONMENT: Lives with: lives with their family Lives in: House/apartment Stairs: Yes: External: 5 steps; bilateral but cannot reach both Has following equipment at home: None  OCCUPATION: sign language interpreter   PLOF: Independent  PATIENT GOALS: confidently walk, strengthen ankle    NEXT MD VISIT: not currently schedule  OBJECTIVE:  Note: Objective measures were completed at Evaluation unless otherwise noted.  DIAGNOSTIC FINDINGS: not on file   PATIENT SURVEYS:  PSFS: THE PATIENT SPECIFIC FUNCTIONAL SCALE  Place score of 0-10 (0 = unable to perform activity and 10 = able to perform activity at  the same level as before injury or problem)  Activity Date: 01/23/2024    Walking the dog  9    2. Going down stairs  9    3.     4.      Total Score 9      Total Score = Sum of activity scores/number of activities  Minimally Detectable Change: 3 points (for single activity); 2 points (for average score)  [facility] (nd). The Patient Specific Functional Scale . Retrieved from [email or web address]   COGNITION: Overall cognitive status: Within functional limits for tasks assessed     SENSATION: Not  tested Patient endorsing that she originally had some n/t but it has dissipated    PALPATION: Not TTP   LOWER EXTREMITY ROM:  Active ROM Right eval Left eval  Hip flexion    Hip extension    Hip abduction    Hip adduction    Hip internal rotation    Hip external rotation    Knee flexion    Knee extension    Ankle dorsiflexion 5deg PF 5deg PF  Ankle plantarflexion 43deg  55deg   Ankle inversion 33deg  45deg  Ankle eversion 16deg 30deg   (Blank rows = not tested)  LOWER EXTREMITY MMT:  MMT Right eval Left eval  Hip flexion (seated) 4/5 4+/5  Hip extension    Hip abduction    Hip adduction    Hip internal rotation    Hip external rotation    Knee flexion (seated) 4+/5 4+/5  Knee extension (seated) 4+/5 4+/5  Ankle dorsiflexion (seated) 4/5 4/5  Ankle plantarflexion (standing) Unable to perform secondary to discomfort/difficulty  15 reps  Ankle inversion    Ankle eversion     (Blank rows = not tested)   FUNCTIONAL TESTS:  SLS: Lt 18s , Rt: 12s   GAIT: Distance walked: not objectively measured  Assistive device utilized: None Level of assistance: supervision Comments: no overt gait deviations noted                                                                                                                                TREATMENT DATE:  01/23/2024 TherEx:  HEP handout provided with patient performing one set of each activity for appropriate form. Verbal cues provided.  PT discussed progression of bilat calf raises into bilat up/unilat down to increase strength and motor control of Rt LE.  Self-Care:  POC, interaction between strength and balance/stability     PATIENT EDUCATION:  Education details: HEP, POC, strength/balance/stability  Person educated: Patient Education method: Explanation, Demonstration, Verbal cues, and Handouts Education comprehension: verbalized understanding, returned demonstration, and verbal cues required  HOME EXERCISE  PROGRAM: Access Code: [email or web address] URL: [email or web address] Date: 01/23/2024 Prepared by: Susannah Daring  Exercises - Gastroc Stretch on Step  - 1 x daily - 7 x weekly - 2 sets - 30sec hold - Seated Ankle Alphabet  - 1 x daily -  7 x weekly - 3 sets - Single Leg Stance  - 1 x daily - 7 x weekly - 2 sets - 15s hold - Heel Raises with Counter Support  - 1 x daily - 7 x weekly - 2 sets - 10 reps - 2sec hold  ASSESSMENT:  CLINICAL IMPRESSION: Patient is a 49 y.o. F who was seen today for physical therapy evaluation and treatment for Rt ankle pain/instability following a distal fibular fracture presenting with deficits in motor coordination, functional mobility, ROM, strength, and balance. Patient tolerated all assessments with the exception of PF MMT secondary to difficulty level/loss of strength. Patient will benefit from skilled PT to address above noted deficits.   OBJECTIVE IMPAIRMENTS: decreased activity tolerance, decreased balance, decreased coordination, difficulty walking, decreased ROM, decreased strength, impaired flexibility, and improper body mechanics.   ACTIVITY LIMITATIONS: squatting and stairs  PARTICIPATION LIMITATIONS: community activity, yard work, and walking dog  PERSONAL FACTORS: Past/current experiences, Time since onset of injury/illness/exacerbation, and 1 comorbidity: history of hernia repair are also affecting patient's functional outcome.   REHAB POTENTIAL: Excellent  CLINICAL DECISION MAKING: Stable/uncomplicated  EVALUATION COMPLEXITY: Low   GOALS: Goals reviewed with patient? Yes  SHORT TERM GOALS: Target date: 02/13/2024 Patient will report compliance with initial HEP.  Baseline: Goal status: INITIAL  2.  Patient will increase single leg stance balance to at least 20s each LE to show improved stability. Baseline:  Goal status: INITIAL  3.  Patient will increase Rt PF MMT to at least 5 reps in order to improve biomechanics with  functional mobility. Baseline:  Goal status: INITIAL   LONG TERM GOALS: Target date: 03/05/2024  Patient will be independent with final HEP in order to maintain and progress upon functional gains made within PT. Baseline:  Goal status: INITIAL  2.  Patient will increase PSFS to at least 10 in order to show improvement in subjective disability rating and increase in confidence with high level tasks.  Baseline:  Goal status: INITIAL  3.  Patient will increase single leg balance to at least 25s each LE to show improved stability. Baseline:  Goal status: INITIAL  4.  Patient will increase Rt PF MMT to at least 10 reps in order to improve biomechanics with functional mobility.  Baseline:  Goal status: INITIAL  5.  Patient will increase bilat DF ROM to at least 5 deg in order to improve functional mobility. Baseline:  Goal status: INITIAL    PLAN:  PT FREQUENCY: 1x/week  PT DURATION: 6 weeks  PLANNED INTERVENTIONS: [id number]- PT Re-evaluation, 97750- Physical Performance Testing, 97110-Therapeutic exercises, 97530- Therapeutic activity, [id number]- Neuromuscular re-education, 97535- Self Care, 02859- Manual therapy, [phone]- Gait training, [phone]- Canalith repositioning, [id number]- Aquatic Therapy, [phone]- Electrical stimulation (unattended), [id number]- Electrical stimulation (manual), [id number]- Vasopneumatic device, [id number]- Ultrasound, [id number]- Traction (mechanical), [id number]- Ionotophoresis 4mg /ml Dexamethasone, 79439 (1-2 muscles), [id number] (3+ muscles)- Dry Needling, Patient/Family education, Balance training, Stair training, Taping, Joint mobilization, Joint manipulation, Spinal manipulation, Spinal mobilization, Vestibular training, DME instructions, Cryotherapy, and Moist heat  PLAN FOR NEXT SESSION: review HEP, ankle strength and mobility, stability and balance    Susannah Daring, PT, DPT 01/23/2024 2:18 PM    "

## 2024-01-29 NOTE — Therapy (Incomplete)
 " OUTPATIENT PHYSICAL THERAPY LOWER EXTREMITY EVALUATION   Patient Name: Marsheila Alejo MRN: [id number] DOB:Aug 25, 1975, 49 y.o., female Today's Date: 01/29/2024  END OF SESSION:    No past medical history on file. Past Surgical History:  Procedure Laterality Date   AUGMENTATION MAMMAPLASTY     15 years ago   CESAREAN SECTION     2 c sections   HERNIA REPAIR     REDUCTION MAMMAPLASTY     lift done at time of augmentation   TONSILLECTOMY AND ADENOIDECTOMY     There are no active problems to display for this patient.   PCP: Not currently listed in chart   REFERRING PROVIDER: Ozell JINNY Largo, MD   REFERRING DIAG: S82.831A Fracture of Rt distal fibula   THERAPY DIAG:  No diagnosis found.  Rationale for Evaluation and Treatment: Rehabilitation  ONSET DATE: September 2025  SUBJECTIVE:   SUBJECTIVE STATEMENT: ***  PERTINENT HISTORY: Patient reports that she grew up playing sports and has had plenty of instability episodes/sprains, though has never fractured any bones before. Patient reports that she stepped into a divot in her yard, but did not go see MD for over 2 months. Patient reports having been cleared by MD after two visits with MD reporting healing of distal fibula fracture with no restrictions for PT. Patient endorses no other surgeries, trauma, or injuries to the area or surrounding joints. Patient reports that she intermittently has radiating pain into hip.   See PMH or personal factors for in depth comorbidities   PAIN:  Are you having pain? Yes: NPRS scale: 0/10 at rest; 2/10 at max  Pain location: Rt ankle, with random pain radiating up to the hip  Pain description: ache Aggravating factors: positioning, overuse  Relieving factors: moving out of the position, OTC but not often   PRECAUTIONS: Fall  RED FLAGS: Not asked in eval    WEIGHT BEARING RESTRICTIONS: No  FALLS:  Has patient fallen in last 6 months? Yes. Number of falls 2, initial fall that  caused the break and the second one was before seeing the MD and it gave out   LIVING ENVIRONMENT: Lives with: lives with their family Lives in: House/apartment Stairs: Yes: External: 5 steps; bilateral but cannot reach both Has following equipment at home: None  OCCUPATION: sign language interpreter   PLOF: Independent  PATIENT GOALS: confidently walk, strengthen ankle    NEXT MD VISIT: not currently schedule  OBJECTIVE:  Note: Objective measures were completed at Evaluation unless otherwise noted.  DIAGNOSTIC FINDINGS: not on file   PATIENT SURVEYS:  PSFS: THE PATIENT SPECIFIC FUNCTIONAL SCALE  Place score of 0-10 (0 = unable to perform activity and 10 = able to perform activity at the same level as before injury or problem)  Activity Date: 01/23/2024    Walking the dog  9    2. Going down stairs  9    3.     4.      Total Score 9      Total Score = Sum of activity scores/number of activities  Minimally Detectable Change: 3 points (for single activity); 2 points (for average score)  [facility] (nd). The Patient Specific Functional Scale . Retrieved from [email or web address]   COGNITION: Overall cognitive status: Within functional limits for tasks assessed     SENSATION: Not tested Patient endorsing that she originally had some n/t but it has dissipated    PALPATION: Not TTP   LOWER EXTREMITY ROM:  Active ROM  Right eval Left eval  Hip flexion    Hip extension    Hip abduction    Hip adduction    Hip internal rotation    Hip external rotation    Knee flexion    Knee extension    Ankle dorsiflexion 5deg PF 5deg PF  Ankle plantarflexion 43deg  55deg   Ankle inversion 33deg  45deg  Ankle eversion 16deg 30deg   (Blank rows = not tested)  LOWER EXTREMITY MMT:  MMT Right eval Left eval  Hip flexion (seated) 4/5 4+/5  Hip extension    Hip abduction    Hip adduction    Hip internal  rotation    Hip external rotation    Knee flexion (seated) 4+/5 4+/5  Knee extension (seated) 4+/5 4+/5  Ankle dorsiflexion (seated) 4/5 4/5  Ankle plantarflexion (standing) Unable to perform secondary to discomfort/difficulty  15 reps  Ankle inversion    Ankle eversion     (Blank rows = not tested)   FUNCTIONAL TESTS:  SLS: Lt 18s , Rt: 12s   GAIT: Distance walked: not objectively measured  Assistive device utilized: None Level of assistance: supervision Comments: no overt gait deviations noted                                                                                                                                TREATMENT DATE:  01/30/2024 ***   01/23/2024 TherEx:  HEP handout provided with patient performing one set of each activity for appropriate form. Verbal cues provided.  PT discussed progression of bilat calf raises into bilat up/unilat down to increase strength and motor control of Rt LE.  Self-Care:  POC, interaction between strength and balance/stability     PATIENT EDUCATION:  Education details: HEP, POC, strength/balance/stability  Person educated: Patient Education method: Explanation, Demonstration, Verbal cues, and Handouts Education comprehension: verbalized understanding, returned demonstration, and verbal cues required  HOME EXERCISE PROGRAM: Access Code: [email or web address] URL: [email or web address] Date: 01/23/2024 Prepared by: Susannah Daring  Exercises - Gastroc Stretch on Step  - 1 x daily - 7 x weekly - 2 sets - 30sec hold - Seated Ankle Alphabet  - 1 x daily - 7 x weekly - 3 sets - Single Leg Stance  - 1 x daily - 7 x weekly - 2 sets - 15s hold - Heel Raises with Counter Support  - 1 x daily - 7 x weekly - 2 sets - 10 reps - 2sec hold  ASSESSMENT:  CLINICAL IMPRESSION: ***  OBJECTIVE IMPAIRMENTS: decreased activity tolerance, decreased balance, decreased coordination, difficulty walking, decreased ROM, decreased strength,  impaired flexibility, and improper body mechanics.   ACTIVITY LIMITATIONS: squatting and stairs  PARTICIPATION LIMITATIONS: community activity, yard work, and walking dog  PERSONAL FACTORS: Past/current experiences, Time since onset of injury/illness/exacerbation, and 1 comorbidity: history of hernia repair are also affecting patient's functional outcome.   REHAB POTENTIAL: Excellent  CLINICAL DECISION MAKING: Stable/uncomplicated  EVALUATION COMPLEXITY:  Low   GOALS: Goals reviewed with patient? Yes  SHORT TERM GOALS: Target date: 02/13/2024 Patient will report compliance with initial HEP.  Baseline: Goal status: INITIAL  2.  Patient will increase single leg stance balance to at least 20s each LE to show improved stability. Baseline:  Goal status: INITIAL  3.  Patient will increase Rt PF MMT to at least 5 reps in order to improve biomechanics with functional mobility. Baseline:  Goal status: INITIAL   LONG TERM GOALS: Target date: 03/05/2024  Patient will be independent with final HEP in order to maintain and progress upon functional gains made within PT. Baseline:  Goal status: INITIAL  2.  Patient will increase PSFS to at least 10 in order to show improvement in subjective disability rating and increase in confidence with high level tasks.  Baseline:  Goal status: INITIAL  3.  Patient will increase single leg balance to at least 25s each LE to show improved stability. Baseline:  Goal status: INITIAL  4.  Patient will increase Rt PF MMT to at least 10 reps in order to improve biomechanics with functional mobility.  Baseline:  Goal status: INITIAL  5.  Patient will increase bilat DF ROM to at least 5 deg in order to improve functional mobility. Baseline:  Goal status: INITIAL    PLAN:  PT FREQUENCY: 1x/week  PT DURATION: 6 weeks  PLANNED INTERVENTIONS: [id number]- PT Re-evaluation, 97750- Physical Performance Testing, 97110-Therapeutic exercises, 97530-  Therapeutic activity, [id number]- Neuromuscular re-education, 97535- Self Care, [id number]- Manual therapy, [id number]- Gait training, [phone]- Canalith repositioning, [id number]- Aquatic Therapy, [phone]- Electrical stimulation (unattended), [phone]- Electrical stimulation (manual), [id number]- Vasopneumatic device, [id number]- Ultrasound, [id number]- Traction (mechanical), [id number]- Ionotophoresis 4mg /ml Dexamethasone, [id number] (1-2 muscles), [id number] (3+ muscles)- Dry Needling, Patient/Family education, Balance training, Stair training, Taping, Joint mobilization, Joint manipulation, Spinal manipulation, Spinal mobilization, Vestibular training, DME instructions, Cryotherapy, and Moist heat  PLAN FOR NEXT SESSION: *** review HEP, ankle strength and mobility, stability and balance    Susannah Daring, PT, DPT 01/29/2024 3:30 PM    "

## 2024-01-30 ENCOUNTER — Encounter

## 2024-02-06 ENCOUNTER — Encounter

## 2024-02-12 ENCOUNTER — Encounter: Admitting: Physical Therapy

## 2024-02-19 ENCOUNTER — Encounter: Admitting: Physical Therapy

## 2024-05-06 IMAGING — MG DIGITAL SCREENING BREAST BILAT IMPLANT W/ TOMO W/ CAD
8 of 13 series · 8 of 29 positions shown · non-contrast
Comparison: None.

CLINICAL DATA: Screening.

EXAM:
DIGITAL SCREENING BILATERAL MAMMOGRAM WITH IMPLANTS, CAD AND
TOMOSYNTHESIS
TECHNIQUE: Bilateral screening digital craniocaudal and mediolateral oblique
mammograms were obtained. Bilateral screening digital breast
tomosynthesis was performed. The images were evaluated with
computer-aided detection. Standard and/or implant displaced views
were performed.

[L MLO]
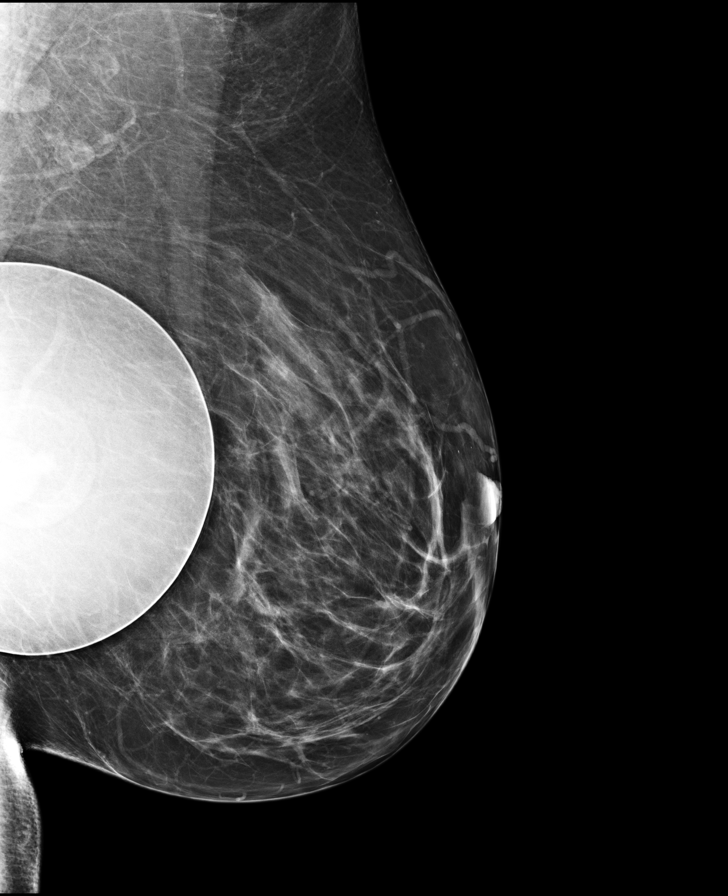

[R MLO]
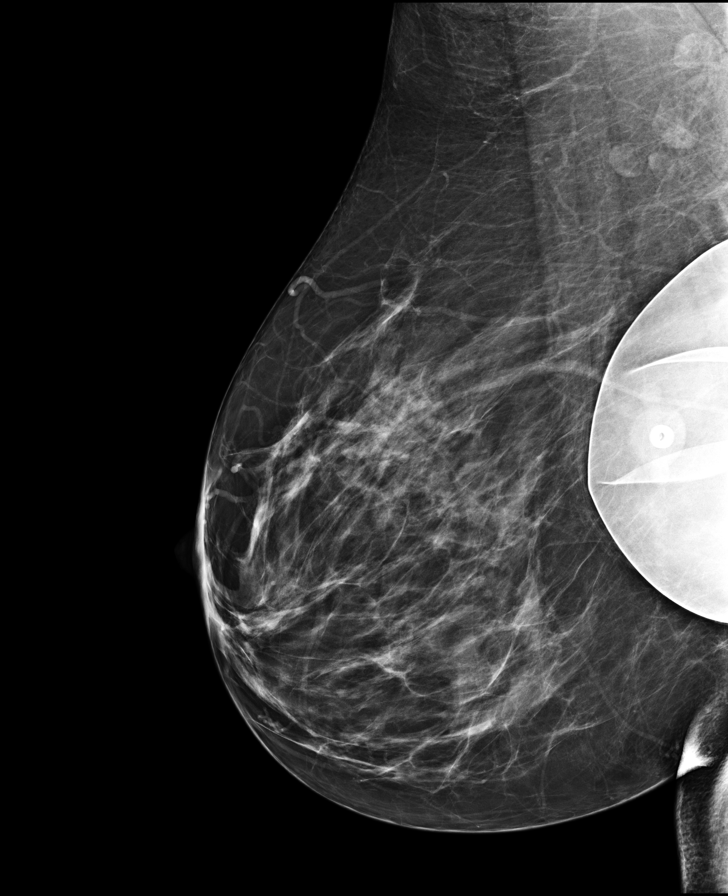

[R CC (1 of 2)]
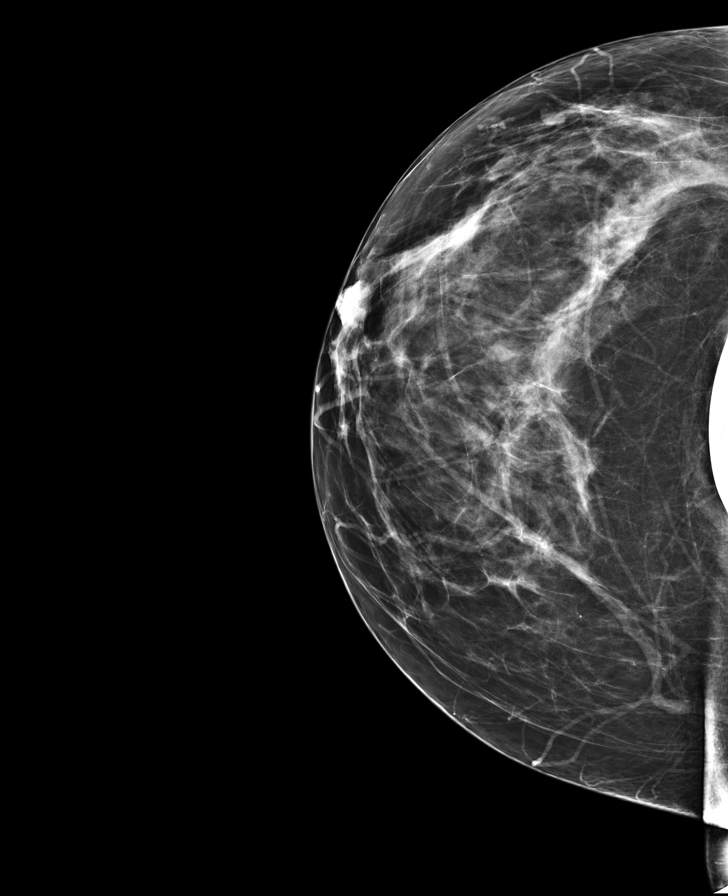

[L CC]
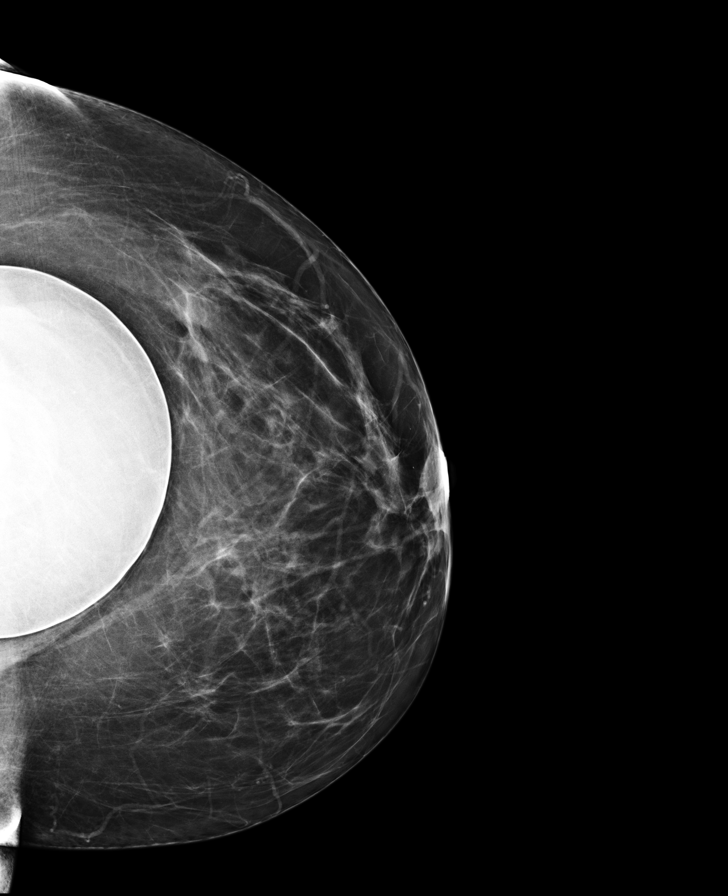

[R CC (2 of 2)]
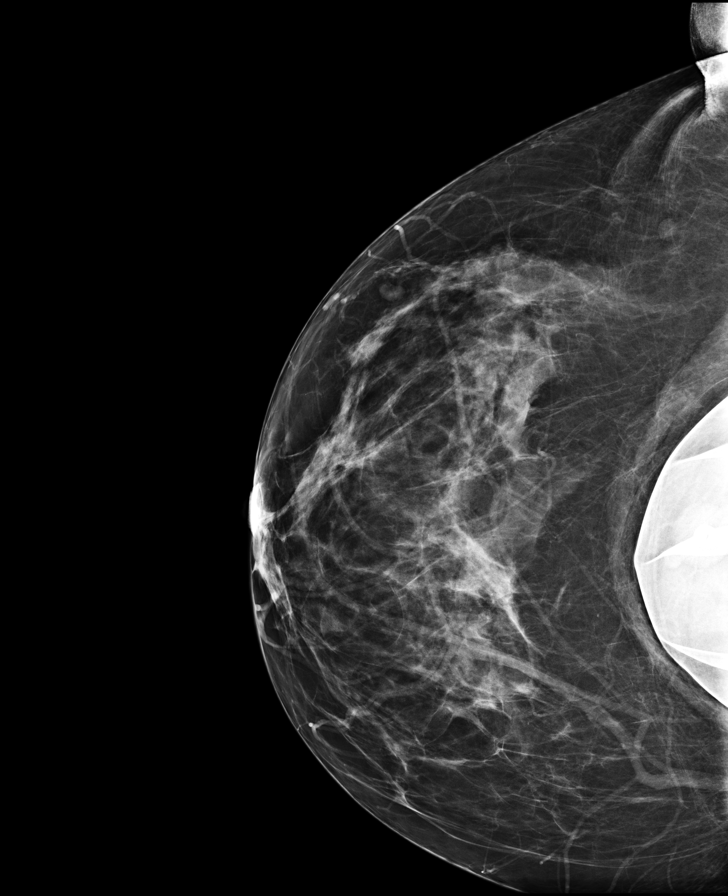

[R MLO synth-2D]
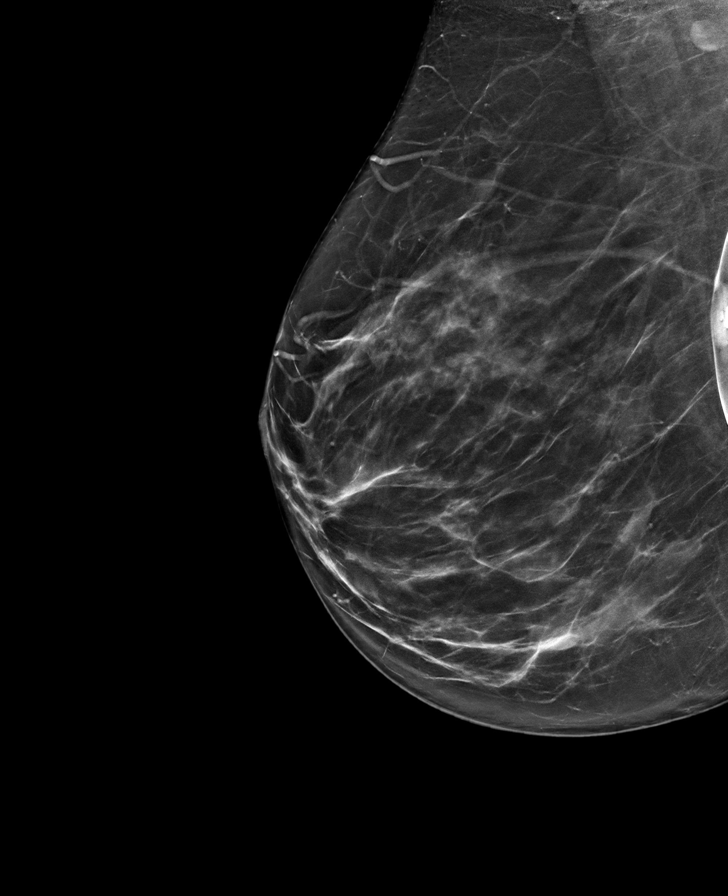

[L MLO synth-2D]
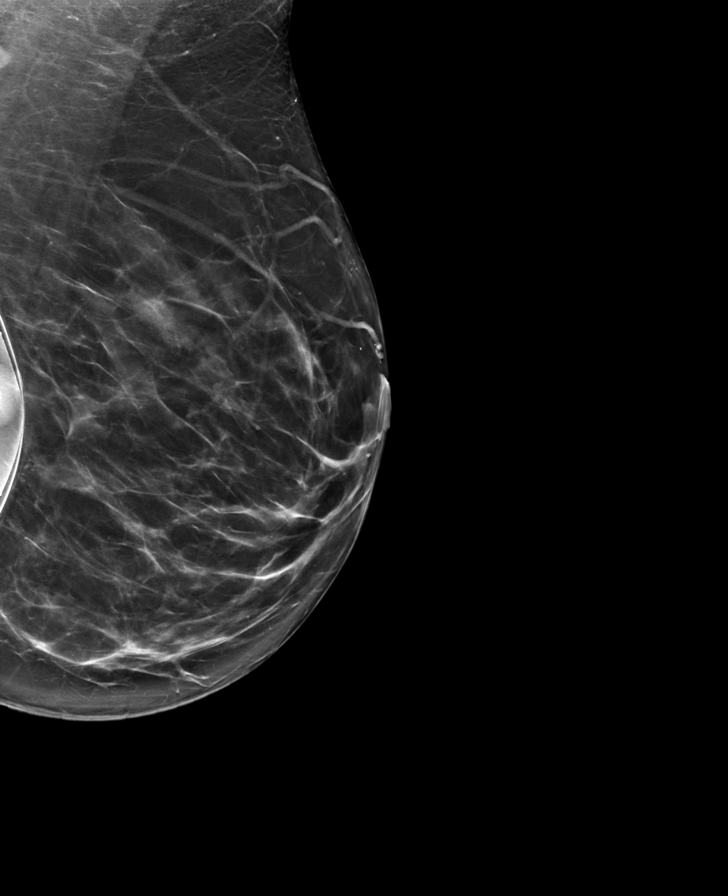

[R CC synth-2D]
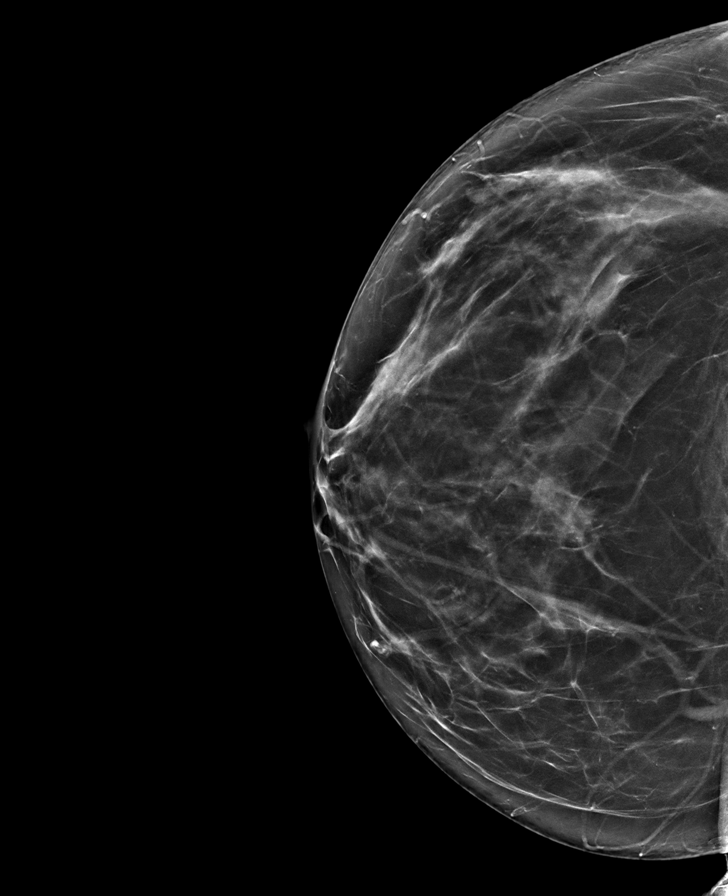

[8 of 29 positions shown; findings below may reference images not displayed]

ACR Breast Density Category b: There are scattered areas of
fibroglandular density.
FINDINGS: The patient has retropectoral implants. There are no findings
suspicious for malignancy.
IMPRESSION: No mammographic evidence of malignancy. A result letter of this
screening mammogram will be mailed directly to the patient.

RECOMMENDATION:
Screening mammogram in one year. (Code:[id number])

BI-RADS CATEGORY  1:  Negative.
